# Patient Record
Sex: Male | Born: 1985 | Race: White | Hispanic: Yes | Marital: Single | Smoking: Never smoker
Health system: Southern US, Community
[De-identification: ages and names within clinical notes are randomized; demographics above are authoritative.]

---

## 2005-11-09 ENCOUNTER — Observation Stay (HOSPITAL_COMMUNITY): Admission: EM | Admit: 2005-11-09 | Discharge: 2005-11-10 | Payer: Self-pay | Admitting: Emergency Medicine

## 2007-08-29 IMAGING — CR DG CHEST 1V PORT
1 series · 1 of 1 positions shown · non-contrast
Comparison: None

CLINICAL DATA: Preop appendicitis

PORTABLE CHEST - 1 VIEW:

[view not recorded]
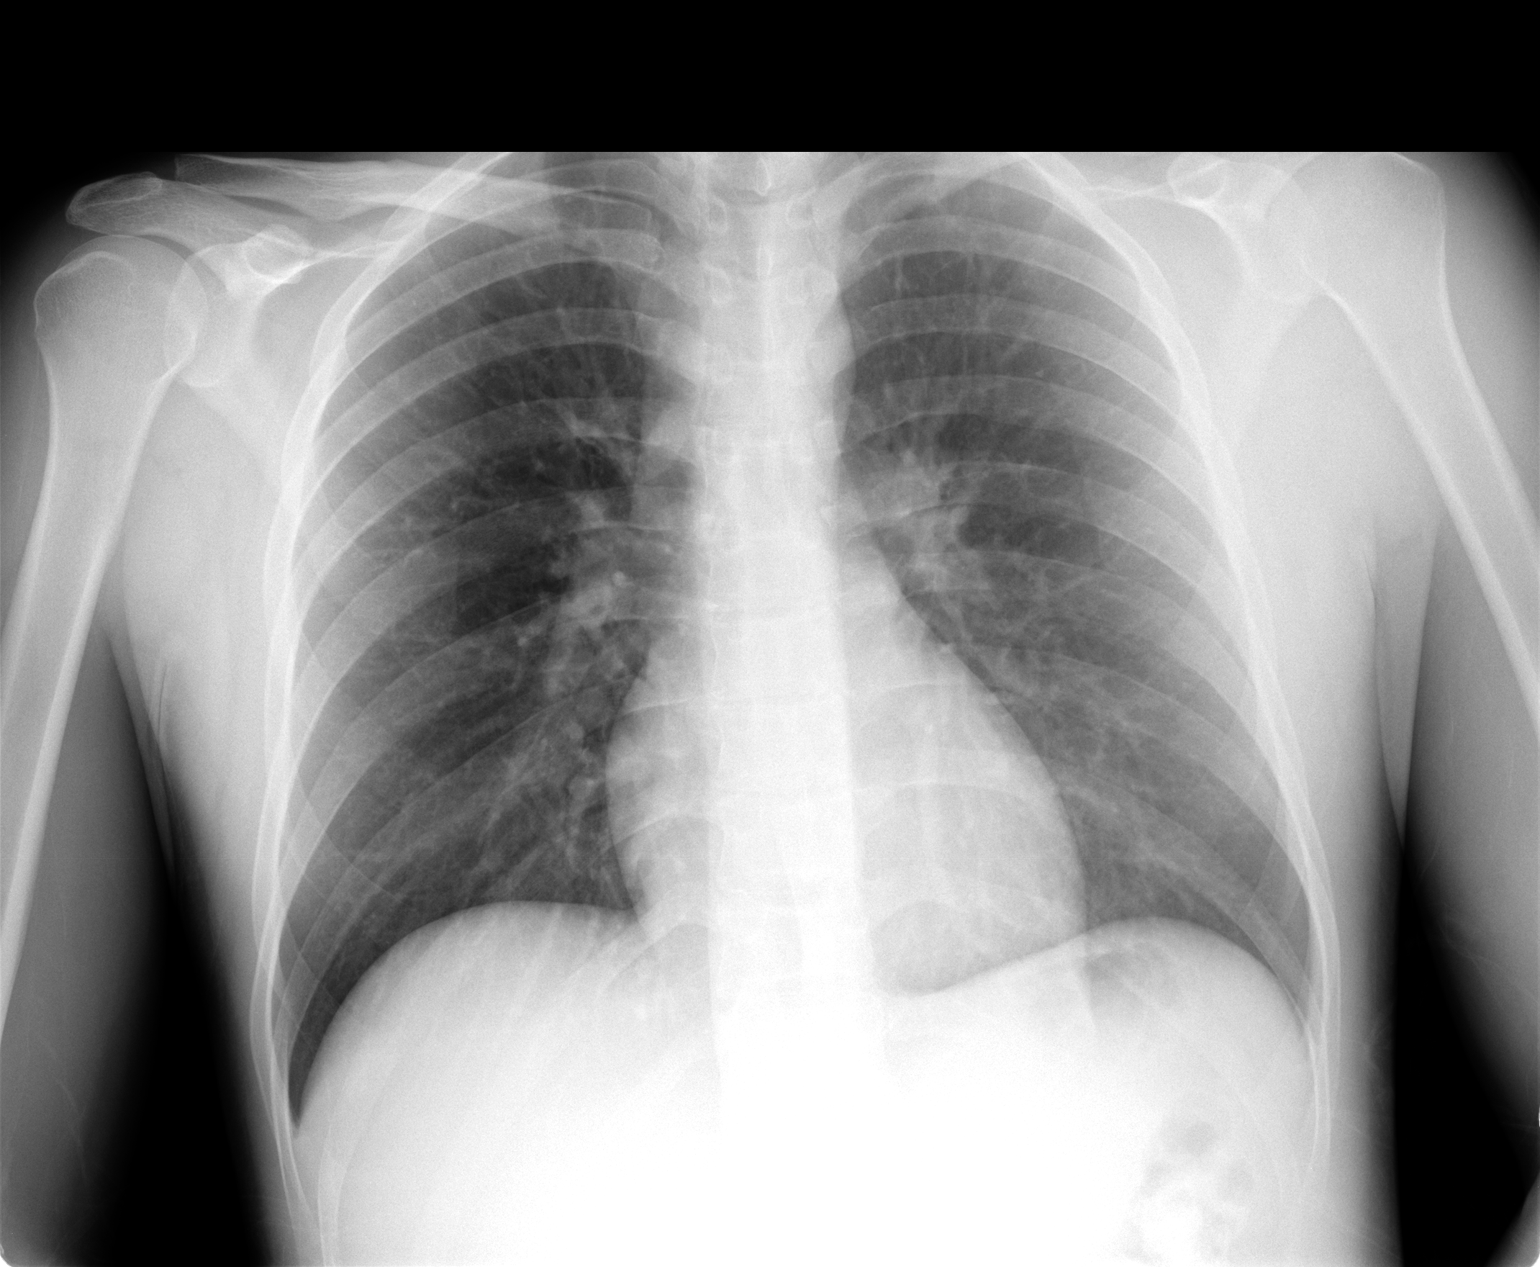

[1 of 1 positions shown; findings below may reference images not displayed]

FINDINGS: Cardiomediastinal contours are within normal limits. Lungs are clear.
No effusions. Visualized skeleton unremarkable.
IMPRESSION: No active disease.

## 2014-12-24 ENCOUNTER — Telehealth: Payer: Self-pay | Admitting: Family Medicine

## 2014-12-24 ENCOUNTER — Encounter: Payer: Self-pay | Admitting: Family

## 2014-12-24 ENCOUNTER — Ambulatory Visit (INDEPENDENT_AMBULATORY_CARE_PROVIDER_SITE_OTHER): Payer: Self-pay | Admitting: Family

## 2014-12-24 DIAGNOSIS — N390 Urinary tract infection, site not specified: Secondary | ICD-10-CM

## 2014-12-24 DIAGNOSIS — R1030 Lower abdominal pain, unspecified: Secondary | ICD-10-CM

## 2014-12-24 DIAGNOSIS — Z202 Contact with and (suspected) exposure to infections with a predominantly sexual mode of transmission: Secondary | ICD-10-CM

## 2014-12-24 LAB — POCT UA - MICROSCOPIC ONLY
Bacteria, U Microscopic: NEGATIVE
Casts, Ur, LPF, POC: NEGATIVE
Crystals, Ur, HPF, POC: NEGATIVE
Epithelial cells, urine per micros: NEGATIVE
Mucus, UA: NEGATIVE
RBC, urine, microscopic: NEGATIVE
WBC, Ur, HPF, POC: NEGATIVE
Yeast, UA: NEGATIVE

## 2014-12-24 LAB — POCT UA - MICROALBUMIN

## 2014-12-24 NOTE — Telephone Encounter (Signed)
Appointment given for 2:25 today with christy.

## 2014-12-24 NOTE — Progress Notes (Signed)
   Subjective:    Patient ID: Bradley Marsh, male    DOB: [birth year], 29 y.o.   MRN: [id number]  HPI Pt presents to the office for LLQ abd pain. PT states he only has pain when he pushes the area. Pt states he does exercises and may have injured himself. Pt noticed this pain on Monday evening. Pt has not taken anything for this pain. Pt is also complaining of discoloration of his penis. Pt states he noticed this discoloration about two months ago after receiving oral sex from someone. Pt denies having sex with her.    Review of Systems  Constitutional: Negative.   HENT: Negative.   Respiratory: Negative.   Cardiovascular: Negative.   Gastrointestinal: Negative.   Endocrine: Negative.   Genitourinary: Negative.   Musculoskeletal: Negative.   Neurological: Negative.   Hematological: Negative.   Psychiatric/Behavioral: Negative.   All other systems reviewed and are negative.      Objective:   Physical Exam  Constitutional: He is oriented to person, place, and time. He appears well-developed and well-nourished. No distress.  HENT:  Head: Normocephalic.  Right Ear: External ear normal.  Left Ear: External ear normal.  Nose: Nose normal.  Mouth/Throat: Oropharynx is clear and moist.  Eyes: Pupils are equal, round, and reactive to light. Right eye exhibits no discharge. Left eye exhibits no discharge.  Neck: Normal range of motion. Neck supple. No thyromegaly present.  Cardiovascular: Normal rate, regular rhythm, normal heart sounds and intact distal pulses.   No murmur heard. Pulmonary/Chest: Effort normal and breath sounds normal. No respiratory distress. He has no wheezes.  Abdominal: Soft. Bowel sounds are normal. He exhibits no distension. There is no tenderness.  Genitourinary: Penis normal. No penile tenderness.  Head of penis darker color on the lower head, pt had a dried scab area/scar area- Pt states that has been there since he was young.   No hernia present    Musculoskeletal: Normal range of motion. He exhibits no edema or tenderness.  Neurological: He is alert and oriented to person, place, and time. He has normal reflexes. No cranial nerve deficit.  Skin: Skin is warm and dry. No rash noted. No erythema.  Psychiatric: He has a normal mood and affect. His behavior is normal. Judgment and thought content normal.  Vitals reviewed.    BP 125/68 mmHg  Pulse 81  Temp(Src) 98.1 F (36.7 C) (Oral)  Ht 5\' 2"  (1.575 m)  Wt 131 lb 3.2 oz (59.512 kg)  BMI 23.99 kg/m2      Assessment & Plan:  1. Lower abdominal pain Motrin and icy hot as needed Rest - POCT UA - Microscopic Only - POCT UA - Microalbumin  3. STD exposure -Safe sex discussed -labs pending -RTO prn - STD Screening Panel/High Risk - HIV antibody (with reflex) - GC/Chlamydia Probe Amp  Jannifer Rodneyhristy Rooney Gladwin, FNP

## 2014-12-24 NOTE — Patient Instructions (Signed)
Enfermedades de transmisin sexual (Sexually Transmitted Disease) Una enfermedad de transmisin sexual (ETS) es toda infeccin que se contagia (transmite) de Neomia Dearuna persona a otra durante la actividad sexual. Puede transmitirse a travs de la saliva, el semen, la Dow[place]angre, el moco vaginal o la [place]. Las enfermedades de transmisin sexual ms frecuentes son:   Bettey MareGonorrea.  Clamidia.  Sfilis.  VIH y DumbartonSIDA.  Herpes genital.  Hepatitis B y C.  Tricomonas.  Virus del papiloma humano (VPH).  [place].  Escabiosis.  Sarna.  Vaginosis bacteriana. CULES SON LAS CAUSAS DE LAS ETS? Una ETS se transmite por bacterias, virus o parsitos. Las ETS se contagian durante la actividad sexual si una de las personas est infectada. Sin embargo, tambin puede trasmitirse por medios no sexuales. Las ETS se trasmiten despus de:   Tener contacto sexual con un compaero infectado.  Compartir juguetes sexuales.  Compartir agujas con una persona infectada o intercambiar piercings o agujas de tatuajes.  Tener un contacto ntimo con los genitales, la boca, o la zona anal de una persona infectada.  Exposicin a los fluidos infectados durante el nacimiento. CULES SON LOS SIGNOS Y SNTOMAS DE UNA INFECCIN POR ETS? Las distintas enfermedades de transmisin sexual tienen diferentes sntomas. Algunas personas no tienen sntomas. Si se presentan sntomas, estos pueden ser:   Miccin dolorosa o con sangre.  Dolor en la pelvis, el abdomen, la vagina, el ano, la garganta o los ojos.  Erupcin cutnea, picazn o irritacin.  Bultos, lceras, ampollas o llagas en la zona genital o anal.  Flujo vaginal anormal con o sin mal olor.  En los hombres, secrecin peniana.  Grant RutsFiebre.  Dolor o Physiological scientisthemorragias durante las relaciones sexuales.  Inflamacin de los ganglios en la zona de la ingle.  Piel y ojos amarillos (ictericia). Esto se observa con la hepatitis.  Hinchazn de los  testculos.  Infertilidad.  Llagas y ampollas en la boca. CMO SE DIAGNOSTICAN LAS ETS? Para realizar un diagnstico, el mdico:   Tax inspectorealizar una historia clnica.  Realizar un examen fsico.  Drue Dunomar una muestra de cualquier secrecin que presente para analizarla.  Tomar una muestra de la garganta, el cuello del tero, la abertura del pene, el recto o la vagina para el [place]estudio.  Analizar una muestra de la primera orina de la [place]maana.  Le pedir DIRECTVanlisis de sangre.  Si corresponde, le tomar la Hunkermuestra para un Papanicolau.  Har una colposcopa.  Har una laparoscopa. CMO SE TRATAN LAS ETS? El tratamiento depende de cul sea la ETS. Algunas ETS pueden tratarse pero no tienen Arub[place]ura.   Clamidia, gonorrea, tricomonas y sfilis pueden curarse con antibiticos.  El herpes genital, la hepatitis y el VIH pueden tratarse, pero no curarse, con medicamentos recetados. Los USG Corporationmedicamentos aliviarn los sntomas.  Las Physiological scientistverrugas genitales en el VPH se extirpan utilizando medicamentos o mediante el congelamiento, el quemado (Automotive engineerelectrocauterizacin) o la Leisure centre managerciruga. Las IT consultantverrugas pueden reaparecer.  El VPH no puede curarse con medicamentos o Azerbaijanciruga. Sin embargo pueden extirparse zonas anormales del cuello del tero, la vagina o la vulva.  Si el diagnstico se confirma, sus compaeros sexuales ms recientes necesitan recibir tratamiento. Deben realizarlo aunque no tengan problemas o sus cultivos o evaluaciones sean negativos. No deben tener relaciones sexuales hasta que sus mdicos los autoricen. CMO PUEDO REDUCIR EL RIESGO DE TENER UNA ETS? Estas acciones le ayudarn a reducir el riesgo de contraer una ETS:  Use condones de ltex, protectores bucales y lubricantes solubles en agua durante la actividad sexual.No utilice vaselina ni aceites.  Evite tener mltiples compaeros sexuales.  No tenga relaciones sexuales con alguien que tiene otros World Fuel Services Corporationcompaeros sexuales.  No tenga relaciones sexuales  con quien no conozca o con quien tenga riesgo de sufrir una ETS.  Evite las prcticas sexuales riesgosas que puedan lacerar la piel.  No tenga relaciones sexuales si tiene llagas abiertas en la boca o piel.  Evite abusar del alcohol o consumir drogas ilegales. El consumo de drogas o alcohol, puede afectar su capacidad de juicio y colocarlo en una posicin vulnerable.  Evite el sexo oral o anal.  Aplquese las vacunas para el VPH y la hepatitis. Si no ha recibido Scientist, product/process developmentestas vacunas, consulte a su mdico si debe aplicarse una o ambas.  Si tiene riesgo de infectarse por el VIH, se recomienda tomar diariamente un medicamento recetado para evitar la infeccin. Esto se conoce como profilaxis previa a la exposicin. Se considera que est en riesgo si:  Es un hombre que tiene sexo con otros hombres.  Es heterosexual y es activo sexualmente con ms de una pareja.  Se inyecta drogas.  Es [place] and Nevisactivo sexualmente con Neomia Dearuna pareja que tiene VIH.  Consulte a su mdico para saber si tiene un alto riesgo de infectarse por el VIH. Si opta por comenzar la profilaxis previa a la exposicin, primero debe realizarse anlisis de deteccin del VIH. Luego, le harn anlisis cada 3meses mientras est tomando los medicamentos para la profilaxis previa a la exposicin. QU DEBO HACER SI CREO QUE TENGO UNA ETS?  Consulte a su mdico.  Hable con su pareja sexual. Ellos deben ser evaluados y recibir tratamiento.  No tenga relaciones sexuales hasta que el mdico lo autorice. CUNDO DEBO BUSCAR ASISTENCIA MDICA INMEDIATA? Comunquese con su mdico de inmediato si:   Siente un dolor abdominal intenso.  Usted es hombre y nota hinchazn o dolor en los testculos.  Usted es mujer y nota hinchazn o dolor en la vagina. Document Released: 06/15/2005 Document Revised: 09/10/2013 [facility] Patient Information 2015 [employer], [place]. This information is not intended to replace advice given to you by your health care provider.  Make sure you discuss any questions you have with your health care provider.

## 2014-12-25 LAB — STD SCREENING PANEL/HIGH RISK
HEP A IGM: NEGATIVE
HIV 1/O/2 Abs-Index Value: <1 (ref <1.00)
HIV-1/HIV-2 Ab: NONREACTIVE
Hep B C IgM: NEGATIVE
Hep C Virus Ab: <0.1 s/co ratio (ref 0.0–0.9)
Hepatitis B Surface Ag: NEGATIVE
RPR: NONREACTIVE

## 2014-12-25 LAB — HIV ANTIBODY (ROUTINE TESTING W REFLEX): HIV Screen 4th Generation wRfx: NONREACTIVE

## 2014-12-26 ENCOUNTER — Telehealth: Payer: Self-pay | Admitting: *Deleted

## 2014-12-26 NOTE — Progress Notes (Signed)
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

## 2014-12-26 NOTE — Telephone Encounter (Signed)
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

## 2014-12-27 LAB — GC/CHLAMYDIA PROBE AMP
CHLAMYDIA, DNA PROBE: NEGATIVE
NEISSERIA GONORRHOEAE BY PCR: NEGATIVE

## 2015-01-15 NOTE — Progress Notes (Signed)
lmtcb

## 2015-01-20 NOTE — Progress Notes (Signed)
lmtcb

## 2015-01-26 ENCOUNTER — Telehealth: Payer: Self-pay | Admitting: Family

## 2015-01-28 ENCOUNTER — Encounter: Payer: Self-pay | Admitting: *Deleted

## 2015-01-28 NOTE — Progress Notes (Signed)
Letter mailed to contact office for recent lab results

## 2015-03-09 NOTE — Telephone Encounter (Signed)
Left patient a voicemail asking him to call back if he is still requesting lab results.

## 2015-04-29 ENCOUNTER — Other Ambulatory Visit: Payer: Self-pay | Admitting: *Deleted

## 2015-05-01 ENCOUNTER — Encounter: Payer: Self-pay | Admitting: *Deleted
